# Patient Record
Sex: Female | Born: 2005 | Race: White | Hispanic: No | Marital: Single | Smoking: Current every day smoker
Health system: Southern US, Community
[De-identification: ages and names within clinical notes are randomized; demographics above are authoritative.]

## PROBLEM LIST (undated history)

## (undated) DIAGNOSIS — F32A Depression, unspecified: Secondary | ICD-10-CM

## (undated) DIAGNOSIS — T7840XA Allergy, unspecified, initial encounter: Secondary | ICD-10-CM

## (undated) DIAGNOSIS — F419 Anxiety disorder, unspecified: Secondary | ICD-10-CM

## (undated) DIAGNOSIS — F909 Attention-deficit hyperactivity disorder, unspecified type: Secondary | ICD-10-CM

## (undated) HISTORY — DX: Allergy, unspecified, initial encounter: T78.40XA

## (undated) HISTORY — DX: Attention-deficit hyperactivity disorder, unspecified type: F90.9

## (undated) HISTORY — DX: Depression, unspecified: F32.A

## (undated) HISTORY — DX: Anxiety disorder, unspecified: F41.9

## (undated) HISTORY — PX: FOOT SURGERY: SHX648

---
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
## 2023-02-02 ENCOUNTER — Encounter: Payer: Self-pay | Admitting: Podiatry

## 2023-02-10 ENCOUNTER — Ambulatory Visit: Payer: BC Managed Care – PPO | Admitting: Podiatry

## 2023-02-22 ENCOUNTER — Institutional Professional Consult (permissible substitution): Payer: BC Managed Care – PPO | Admitting: Family

## 2023-03-18 ENCOUNTER — Encounter: Payer: Self-pay | Admitting: Podiatry

## 2023-03-20 ENCOUNTER — Encounter: Payer: Self-pay | Admitting: Obstetrics and Gynecology

## 2023-03-20 DIAGNOSIS — Z3041 Encounter for surveillance of contraceptive pills: Secondary | ICD-10-CM

## 2023-03-20 NOTE — Telephone Encounter (Signed)
AEX 05/10/2022--recall sent per EMR. Nothing scheduled yet. Please advise if ok to send 30 or 90 days supply to pt? Thanks.   P.S. Will remind her to schedule AEX at her earliest convenience.

## 2023-03-21 NOTE — Telephone Encounter (Signed)
Per BS: "Ok to refill birth control pills for 3 months with no refills. Needs office visit for further medication.  [employer]"

## 2023-03-22 MED ORDER — DROSPIRENONE-ETHINYL ESTRADIOL 3-0.03 MG PO TABS: 1.0000 | ORAL_TABLET | Freq: Every day | ORAL | 0 refills | Status: DC

## 2023-03-22 NOTE — Telephone Encounter (Signed)
Routing to provider for final review and closing.

## 2023-04-10 ENCOUNTER — Other Ambulatory Visit: Payer: Self-pay | Admitting: Obstetrics and Gynecology

## 2023-04-10 DIAGNOSIS — Z3041 Encounter for surveillance of contraceptive pills: Secondary | ICD-10-CM

## 2023-04-11 NOTE — Telephone Encounter (Signed)
Med refill request:Yasmin 3-0.03 mg tab Last AEX: 05/10/22 -BS Next AEX: Not scheduled. Last MMG (if hormonal med) N/A  Call to patient. Left message to call [facility] at [phone], option 4. Did not leave detailed message, number not on dpr.  Call placed to home number, spoke with father, was advised [place] in [place], no additional information provided.    Rx refused. Rx sent on 03/22/23 #84/0RF Needs updated AEX.   Routing to provider for final review. Will close encounter.

## 2023-04-11 NOTE — Telephone Encounter (Addendum)
Pt returned call.  Msg sent to appt desk to schedule AEX.   Left this # to CB [phone].

## 2023-04-13 ENCOUNTER — Encounter: Payer: Self-pay | Admitting: Podiatry

## 2023-05-16 ENCOUNTER — Ambulatory Visit: Payer: BC Managed Care – PPO | Admitting: Podiatry

## 2023-06-10 ENCOUNTER — Other Ambulatory Visit: Payer: Self-pay | Admitting: Obstetrics and Gynecology

## 2023-06-10 DIAGNOSIS — Z3041 Encounter for surveillance of contraceptive pills: Secondary | ICD-10-CM

## 2023-06-12 NOTE — Telephone Encounter (Signed)
Medication refill request: yasmin Last AEX:  05-10-22 Next AEX: 08-28-23 Last MMG (if hormonal medication request): 02-22-12 breast u/s neg Refill authorized: please approve if appropriate

## 2023-08-01 ENCOUNTER — Ambulatory Visit: Payer: BC Managed Care – PPO | Admitting: Physician Assistant

## 2023-08-01 ENCOUNTER — Encounter: Payer: Self-pay | Admitting: Physician Assistant

## 2023-08-01 VITALS — BP 100/70 | HR 94 | Temp 98.2°F | Ht 63.0 in | Wt 141.0 lb

## 2023-08-01 DIAGNOSIS — R0981 Nasal congestion: Secondary | ICD-10-CM | POA: Diagnosis not present

## 2023-08-01 DIAGNOSIS — R5383 Other fatigue: Secondary | ICD-10-CM

## 2023-08-01 DIAGNOSIS — M545 Low back pain, unspecified: Secondary | ICD-10-CM

## 2023-08-01 MED ORDER — AMOXICILLIN-POT CLAVULANATE 875-125 MG PO TABS: 1.0000 | ORAL_TABLET | Freq: Two times a day (BID) | ORAL | 0 refills | Status: DC

## 2023-08-01 NOTE — Patient Instructions (Signed)
It was great to see you!  Take care,  Krystiana Fornes PA-C  

## 2023-08-01 NOTE — Progress Notes (Signed)
Kiara Travis is a 17 y.o. female here for a new problem.  History of Present Illness:   Chief Complaint  Patient presents with   Back Pain    Pt c/o low back pain x 1 week, took Ibuprofen and heating pad some relief, prior to menstrual cycle.   Sinus Problem    Pt c/o sinus problem, nasal congestion, ear pressure x 4 days, nausea. Slight cough expectorating yellow/green sputum. Denies fever or chills.   Fatigue    Pt c/o feeling tired x 6 months, bruising on legs.   HPI  Low Back Pain: Complains of low back pain for the past week. Describes trying to bend over to pick up clothes and feeling pain that took her breath away.  Reports the severity of her pain has remained the same.  States that she has tried Ibuprofen (with temporary relief) and using a heating pad before bed.  Denies prior injury, UTI sx, numbness/tingling, pain diffuse to legs, saddle anesthesia, or tenderness to spine.  Nasal Congestion: Complains of nasal and sinus congestion, ear congestion, and nausea for the past 4 days.  Reports her initial symptom was a sore throat that has since resolved.  Otherwise endorses mild cough with expectoration of yellow/green sputum. States that she tried a [employer] but it couldn't drain properly, and Sudafed.  Denies fever/chills.  Fatigue: Complains of fatigue for the past 6 months.  Endorses bruising on legs and menorrhagia.  Reports that she is currently on Yasmin birth control.  Denies concern for pregnancy. She has a history of iron deficiency and is wondering if she needs iron supplementation She reports that she has very heavy periods  Past Medical History:  Diagnosis Date   ADHD (attention deficit hyperactivity disorder)    Allergy    Anxiety    Depression     Social History   Tobacco Use   Smoking status: Every Day    Types: E-cigarettes    Start date: 08/29/2017   Smokeless tobacco: Never  Vaping Use   Vaping status: Every Day   Substances: Nicotine   Substance Use Topics   Alcohol use: Not Currently    Comment: 1 beer/month   Drug use: No   Past Surgical History:  Procedure Laterality Date   FOOT SURGERY     Right   Family History  Problem Relation Age of Onset   Miscarriages / Stillbirths Mother    Arthritis Mother    Hypertension Father    Hyperlipidemia Father    Hearing loss Father    Arthritis Maternal Grandmother    Drug abuse Maternal Grandfather    Alcohol abuse Maternal Grandfather    Hyperlipidemia Paternal Grandmother    Heart disease Paternal Grandmother    Hearing loss Paternal Grandfather    Heart disease Paternal Grandfather    Hyperlipidemia Paternal Grandfather    Heart attack Paternal Grandfather    Allergies  Allergen Reactions   Chlorine Rash   Latex Rash   Nystatin Rash and Hives   Sulfa Antibiotics Hives    Verified hives   Current Medications:   Current Outpatient Medications:    drospirenone-ethinyl estradiol (YASMIN) 3-0.03 MG tablet, TAKE 1 TABLET BY MOUTH EVERY DAY, Disp: 84 tablet, Rfl: 0   ibuprofen (ADVIL) 600 MG tablet, Take 1 tablet (600 mg total) by mouth every 6 (six) hours as needed., Disp: 30 tablet, Rfl: 1   JORNAY PM 20 MG CP24, Take 1 capsule by mouth at bedtime., Disp: , Rfl:  Multiple Vitamin (MULTIVITAMIN) capsule, Take 1 capsule by mouth daily., Disp: , Rfl:    sertraline (ZOLOFT) 25 MG tablet, Take 25 mg by mouth daily., Disp: , Rfl:   Review of Systems:   ROS See pertinent positives and negatives as per the HPI.  Vitals:   Vitals:   08/01/23 1451  BP: 100/70  Pulse: 94  Temp: 98.2 F (36.8 C)  TempSrc: Temporal  SpO2: 98%  Weight: 141 lb (64 kg)  Height: 5\' 3"  (1.6 m)     Body mass index is 24.98 kg/m.  Physical Exam:   Physical Exam Vitals and nursing note reviewed.  Constitutional:      General: She is not in acute distress.    Appearance: She is well-developed. She is not ill-appearing or toxic-appearing.  HENT:     Head: Normocephalic and  atraumatic.     Right Ear: Tympanic membrane, ear canal and external ear normal. Tympanic membrane is not erythematous, retracted or bulging.     Left Ear: Tympanic membrane, ear canal and external ear normal. Tympanic membrane is not erythematous, retracted or bulging.     Nose:     Right Sinus: Maxillary sinus tenderness and frontal sinus tenderness present.     Left Sinus: Maxillary sinus tenderness and frontal sinus tenderness present.     Mouth/Throat:     Pharynx: Uvula midline. No posterior oropharyngeal erythema.  Eyes:     General: Lids are normal.     Conjunctiva/sclera: Conjunctivae normal.  Neck:     Trachea: Trachea normal.  Cardiovascular:     Rate and Rhythm: Normal rate and regular rhythm.     Pulses: Normal pulses.     Heart sounds: Normal heart sounds, S1 normal and S2 normal.  Pulmonary:     Effort: Pulmonary effort is normal.     Breath sounds: Normal breath sounds. No decreased breath sounds, wheezing, rhonchi or rales.  Musculoskeletal:     Comments: No decreased ROM 2/2 pain with flexion/extension, lateral side bends, or rotation. Very mild reproducible tenderness with deep palpation to bilateral paraspinal lumbar muscles. No bony tenderness. No evidence of erythema, rash or ecchymosis.   Lymphadenopathy:     Cervical: No cervical adenopathy.  Skin:    General: Skin is warm and dry.  Neurological:     Mental Status: She is alert.     GCS: GCS eye subscore is 4. GCS verbal subscore is 5. GCS motor subscore is 6.  Psychiatric:        Speech: Speech normal.        Behavior: Behavior normal. Behavior is cooperative.     Assessment and Plan:   Sinus congestion No red flags on exam.   Will initiate Augmentin per orders.  Discussed taking medications as prescribed.  Reviewed return precautions including new or worsening fever, SOB, new or worsening cough or other concerns.  Push fluids and rest.  I recommend that patient follow-up if symptoms worsen or  persist despite treatment x 7-10 days, sooner if needed.  Acute bilateral low back pain without sciatica No red flags Suspect muscle strain Recommend continued supportive care with gentle stretching and NSAIDs as needed If new, worsening or lack of improvement in a total of 2 weeks recommend close follow-up  Fatigue, unspecified type Patient is requesting iron panel Denies any other specific blood work or workup for fatigue at this time Follow-up based on results and clinical symptoms   I,Emily Lagle,acting as a Neurosurgeon for [employer], PA.,have documented  all relevant documentation on the behalf of Jarold Motto, PA,as directed by  Jarold Motto, PA while in the presence of Jarold Motto, [place].  I, Jarold Motto, [place], have reviewed all documentation for this visit. The documentation on 08/01/23 for the exam, diagnosis, procedures, and orders are all accurate and complete.  Jarold Motto, PA-C

## 2023-08-02 LAB — COMPREHENSIVE METABOLIC PANEL
ALT: 16 U/L (ref 0–35)
AST: 19 U/L (ref 0–37)
Albumin: 4.6 g/dL (ref 3.5–5.2)
Alkaline Phosphatase: 49 U/L (ref 47–119)
BUN: 7 mg/dL (ref 6–23)
CO2: 26 meq/L (ref 19–32)
Calcium: 9.4 mg/dL (ref 8.4–10.5)
Chloride: 104 meq/L (ref 96–112)
Creatinine, Ser: 0.78 mg/dL (ref 0.40–1.20)
GFR: 110 mL/min (ref >60.00)
Glucose, Bld: 97 mg/dL (ref 70–99)
Potassium: 3.9 meq/L (ref 3.5–5.1)
Sodium: 138 meq/L (ref 135–145)
Total Bilirubin: 0.3 mg/dL (ref 0.2–1.2)
Total Protein: 7.3 g/dL (ref 6.0–8.3)

## 2023-08-02 LAB — IBC + FERRITIN
Ferritin: 25.9 ng/mL (ref 10.0–291.0)
Iron: 58 ug/dL (ref 42–145)
Saturation Ratios: 16.2 % — ABNORMAL LOW (ref 20.0–50.0)
TIBC: 357 ug/dL (ref 250.0–450.0)
Transferrin: 255 mg/dL (ref 212.0–360.0)

## 2023-08-03 ENCOUNTER — Encounter: Payer: Self-pay | Admitting: Physician Assistant

## 2023-08-14 NOTE — Progress Notes (Signed)
17 y.o. G0P0000 Single Caucasian female here for annual exam.  Pt wants to discuss hair loss that is occurring for the last several months.   Taking her birth control.  Wants to continue.   Off Lexapro. Taking Zoloft 50 mg daily.    Had her iron checked in 08/01/23.  Iron was 58.   Sees dermatology yearly for a mole skin check.   Moved back from [place] a month ago.  Just had a break up.   Patient is helping her dad build a house.   PCP: Jarold Motto, PA   Patient's last menstrual period was 07/31/2023 (exact date).           Sexually active: Yes.    The current method of family planning is OCP (estrogen/progesterone).    Menopausal hormone therapy:  n/a Exercising: No.   Smoker:  no, vaping  OB History  Gravida Para Term Preterm AB Living  0 0 0 0 0 0  SAB IAB Ectopic Multiple Live Births  0 0 0 0 0     HEALTH MAINTENANCE: Last 2 paps:  n/a History of abnormal Pap or positive HPV:  no Mammogram:   n/a Colonoscopy:  n/a Bone Density:  n/a  Result  n/a   Immunization History  Administered Date(s) Administered   HIB (PRP-OMP) 03/08/2004, 05/11/2004, 07/12/2004, 01/13/2005   Hepatitis A, Ped/Adol-2 Dose 04/15/2005, 11/01/2005   Hepatitis B, PED/ADOLESCENT 03/08/2004, 05/11/2004, 07/12/2004   Influenza,inj,Quad PF,6+ Mos 05/03/2019   MMR 01/13/2005, 01/27/2009   Meningococcal B Recombinant 11/17/2020, 04/20/2021   Meningococcal Conjugate 05/29/2015, 11/17/2020   PFIZER(Purple Top)SARS-COV-2 Vaccination 03/23/2020, 04/17/2020   Pneumococcal Conjugate-13 03/08/2004, 05/11/2004, 07/12/2004, 01/13/2005   Td 05/29/2015   Tdap 05/29/2015   Varicella 01/13/2005, 01/27/2009      reports that she has been smoking e-cigarettes. She started smoking about 6 years ago. She has never used smokeless tobacco. She reports that she does not currently use alcohol. She reports that she does not use drugs.  Past Medical History:  Diagnosis Date   ADHD (attention deficit  hyperactivity disorder)    Allergy    Anxiety    Depression     Past Surgical History:  Procedure Laterality Date   FOOT SURGERY     Right    Current Outpatient Medications  Medication Sig Dispense Refill   drospirenone-ethinyl estradiol (YASMIN) 3-0.03 MG tablet TAKE 1 TABLET BY MOUTH EVERY DAY 84 tablet 0   ibuprofen (ADVIL) 600 MG tablet Take 1 tablet (600 mg total) by mouth every 6 (six) hours as needed. 30 tablet 1   JORNAY PM 20 MG CP24 Take 1 capsule by mouth at bedtime.     Multiple Vitamin (MULTIVITAMIN) capsule Take 1 capsule by mouth daily.     sertraline (ZOLOFT) 50 MG tablet Take 50 mg by mouth daily.     No current facility-administered medications for this visit.    ALLERGIES: Chlorine, Latex, Nystatin, and Sulfa antibiotics  Family History  Problem Relation Age of Onset   Miscarriages / [place] Mother    Arthritis Mother    Hypertension Father    Hyperlipidemia Father    Hearing loss Father    Arthritis Maternal Grandmother    Drug abuse Maternal Grandfather    Alcohol abuse Maternal Grandfather    Hyperlipidemia Paternal Grandmother    Heart disease Paternal Grandmother    Hearing loss Paternal Grandfather    Heart disease Paternal Grandfather    Hyperlipidemia Paternal Grandfather    Heart attack Paternal  Grandfather     Review of Systems  All other systems reviewed and are negative.   PHYSICAL EXAM:  BP 114/70 (BP Location: Left Arm, Patient Position: Sitting, Cuff Size: Small)   Ht 5\' 4"  (1.626 m)   Wt 137 lb (62.1 kg)   LMP 07/31/2023 (Exact Date)   BMI 23.52 kg/m     General appearance: alert, cooperative and appears stated age Head: normocephalic, without obvious abnormality, atraumatic Neck: no adenopathy, supple, symmetrical, trachea midline and thyroid normal to inspection and palpation Lungs: clear to auscultation bilaterally Breasts: normal appearance, no masses or tenderness, No nipple retraction or dimpling, No nipple  discharge or bleeding, No axillary adenopathy Heart: regular rate and rhythm Abdomen: soft, non-tender; no masses, no organomegaly Extremities: extremities normal, atraumatic, no cyanosis or edema Skin: skin color, texture, turgor normal. No rashes or lesions Lymph nodes: cervical, supraclavicular, and axillary nodes normal. Neurologic: grossly normal  Pelvic: External genitalia:  no lesions              No abnormal inguinal nodes palpated.              Urethra:  normal appearing urethra with no masses, tenderness or lesions              Bartholins and Skenes: normal                 Vagina: normal appearing vagina with normal color and discharge, no lesions              Cervix: no lesions              Pap taken: No. Bimanual Exam:  Uterus:  normal size, contour, position, consistency, mobility, non-tender              Adnexa: no mass, fullness, tenderness       Chaperone was present for exam:  Warren Lacy, CMA  ASSESSMENT: Well woman visit with gynecologic exam Hair loss.   STD screening.   PLAN:  Self breast awareness reviewed. Pap and HRV collected:  no.  Will do at age 49.  Guidelines for Calcium, Vitamin D, regular exercise program including cardiovascular and weight bearing exercise. Medication refills:  Yasmin x 1 year.  We discussed etiologies of hair loss:  hypothyroidism, changes in hormones (stopping pills), iron deficiency, stress. TFTs, STD screening.  Follow up:  yearly and prn.

## 2023-08-28 ENCOUNTER — Other Ambulatory Visit (HOSPITAL_COMMUNITY)
Admission: RE | Admit: 2023-08-28 | Discharge: 2023-08-28 | Disposition: A | Discharge status: Home / Self Care | Payer: BC Managed Care – PPO | Source: Ambulatory Visit | Attending: Obstetrics and Gynecology | Admitting: Obstetrics and Gynecology

## 2023-08-28 ENCOUNTER — Encounter: Payer: Self-pay | Admitting: Obstetrics and Gynecology

## 2023-08-28 ENCOUNTER — Ambulatory Visit (INDEPENDENT_AMBULATORY_CARE_PROVIDER_SITE_OTHER): Payer: BC Managed Care – PPO | Admitting: Obstetrics and Gynecology

## 2023-08-28 VITALS — BP 114/70 | Ht 64.0 in | Wt 137.0 lb

## 2023-08-28 DIAGNOSIS — Z3041 Encounter for surveillance of contraceptive pills: Secondary | ICD-10-CM

## 2023-08-28 DIAGNOSIS — L659 Nonscarring hair loss, unspecified: Secondary | ICD-10-CM | POA: Diagnosis not present

## 2023-08-28 DIAGNOSIS — Z113 Encounter for screening for infections with a predominantly sexual mode of transmission: Secondary | ICD-10-CM | POA: Diagnosis present

## 2023-08-28 DIAGNOSIS — Z1159 Encounter for screening for other viral diseases: Secondary | ICD-10-CM

## 2023-08-28 DIAGNOSIS — Z01419 Encounter for gynecological examination (general) (routine) without abnormal findings: Secondary | ICD-10-CM

## 2023-08-28 DIAGNOSIS — Z114 Encounter for screening for human immunodeficiency virus [HIV]: Secondary | ICD-10-CM

## 2023-08-28 MED ORDER — DROSPIRENONE-ETHINYL ESTRADIOL 3-0.03 MG PO TABS: 1.0000 | ORAL_TABLET | Freq: Every day | ORAL | 3 refills | Status: DC

## 2023-08-28 NOTE — Patient Instructions (Signed)

## 2023-08-29 LAB — HEPATITIS C ANTIBODY: Hepatitis C Ab: NONREACTIVE

## 2023-08-29 LAB — T4, FREE: Free T4: 1.2 ng/dL (ref 0.8–1.4)

## 2023-08-29 LAB — CERVICOVAGINAL ANCILLARY ONLY
Chlamydia: NEGATIVE
Comment: NEGATIVE
Comment: NEGATIVE
Comment: NORMAL
Neisseria Gonorrhea: NEGATIVE
Trichomonas: NEGATIVE

## 2023-08-29 LAB — RPR: RPR Ser Ql: NONREACTIVE

## 2023-08-29 LAB — HIV ANTIBODY (ROUTINE TESTING W REFLEX): HIV 1&2 Ab, 4th Generation: NONREACTIVE

## 2023-08-29 LAB — TSH: TSH: 1.09 m[IU]/L

## 2023-10-11 DIAGNOSIS — M79642 Pain in left hand: Secondary | ICD-10-CM | POA: Insufficient documentation

## 2023-11-05 ENCOUNTER — Encounter: Payer: Self-pay | Admitting: Obstetrics and Gynecology

## 2023-11-09 ENCOUNTER — Ambulatory Visit: Payer: BC Managed Care – PPO | Admitting: Physician Assistant

## 2023-11-09 ENCOUNTER — Encounter: Payer: Self-pay | Admitting: Physician Assistant

## 2023-11-09 VITALS — BP 102/62 | HR 64 | Temp 97.7°F | Ht 64.0 in | Wt 135.0 lb

## 2023-11-09 DIAGNOSIS — F411 Generalized anxiety disorder: Secondary | ICD-10-CM | POA: Diagnosis not present

## 2023-11-09 DIAGNOSIS — D509 Iron deficiency anemia, unspecified: Secondary | ICD-10-CM

## 2023-11-09 DIAGNOSIS — F900 Attention-deficit hyperactivity disorder, predominantly inattentive type: Secondary | ICD-10-CM

## 2023-11-09 DIAGNOSIS — E559 Vitamin D deficiency, unspecified: Secondary | ICD-10-CM | POA: Diagnosis not present

## 2023-11-09 DIAGNOSIS — Z0000 Encounter for general adult medical examination without abnormal findings: Secondary | ICD-10-CM | POA: Diagnosis not present

## 2023-11-09 LAB — CBC WITH DIFFERENTIAL/PLATELET
Basophils Absolute: 0.1 10*3/uL (ref 0.0–0.1)
Basophils Relative: 0.9 % (ref 0.0–3.0)
Eosinophils Absolute: 0.1 10*3/uL (ref 0.0–0.7)
Eosinophils Relative: 1.5 % (ref 0.0–5.0)
HCT: 36.9 % (ref 36.0–49.0)
Hemoglobin: 12.7 g/dL (ref 12.0–16.0)
Lymphocytes Relative: 30.4 % (ref 24.0–48.0)
Lymphs Abs: 1.6 10*3/uL (ref 0.7–4.0)
MCHC: 34.4 g/dL (ref 31.0–37.0)
MCV: 90.5 fl (ref 78.0–98.0)
Monocytes Absolute: 0.3 10*3/uL (ref 0.1–1.0)
Monocytes Relative: 5.8 % (ref 3.0–12.0)
Neutro Abs: 3.3 10*3/uL (ref 1.4–7.7)
Neutrophils Relative %: 61.4 % (ref 43.0–71.0)
Platelets: 263 10*3/uL (ref 150.0–575.0)
RBC: 4.08 Mil/uL (ref 3.80–5.70)
RDW: 12.3 % (ref 11.4–15.5)
WBC: 5.4 10*3/uL (ref 4.5–13.5)

## 2023-11-09 LAB — LIPID PANEL
Cholesterol: 164 mg/dL (ref 0–200)
HDL: 68.5 mg/dL (ref >39.00)
LDL Cholesterol: 83 mg/dL (ref 0–99)
NonHDL: 95.59
Total CHOL/HDL Ratio: 2
Triglycerides: 61 mg/dL (ref 0.0–149.0)
VLDL: 12.2 mg/dL (ref 0.0–40.0)

## 2023-11-09 LAB — COMPREHENSIVE METABOLIC PANEL
ALT: 14 U/L (ref 0–35)
AST: 14 U/L (ref 0–37)
Albumin: 4.2 g/dL (ref 3.5–5.2)
Alkaline Phosphatase: 41 U/L — ABNORMAL LOW (ref 47–119)
BUN: 8 mg/dL (ref 6–23)
CO2: 27 meq/L (ref 19–32)
Calcium: 9.4 mg/dL (ref 8.4–10.5)
Chloride: 106 meq/L (ref 96–112)
Creatinine, Ser: 0.77 mg/dL (ref 0.40–1.20)
GFR: 111.5 mL/min (ref >60.00)
Glucose, Bld: 65 mg/dL — ABNORMAL LOW (ref 70–99)
Potassium: 4.2 meq/L (ref 3.5–5.1)
Sodium: 140 meq/L (ref 135–145)
Total Bilirubin: 0.2 mg/dL (ref 0.2–1.2)
Total Protein: 6.5 g/dL (ref 6.0–8.3)

## 2023-11-09 LAB — IBC + FERRITIN
Ferritin: 30 ng/mL (ref 10.0–291.0)
Iron: 90 ug/dL (ref 42–145)
Saturation Ratios: 23.5 % (ref 20.0–50.0)
TIBC: 383.6 ug/dL (ref 250.0–450.0)
Transferrin: 274 mg/dL (ref 212.0–360.0)

## 2023-11-09 LAB — VITAMIN D 25 HYDROXY (VIT D DEFICIENCY, FRACTURES): VITD: 31.31 ng/mL (ref 30.00–100.00)

## 2023-11-09 NOTE — Progress Notes (Signed)
 Subjective:    Kiara Travis is a 18 y.o. female and is here for a comprehensive physical exam.  HPI  Acute Concerns: None.  Chronic Issues: GAD // ADHD Pt is on Zoloft  mg once daily and Jornay 20 mg once nightly.  Good compliance and tolerance reported.  States Ophelia Charter is the only ADHD medication that has significantly helped her.   Vitamin D and iron deficiency She would like blood work checked today to evaluate for this She takes supplements for both vitamin D and iron every other day  Health Maintenance: Immunizations -- UTD Colonoscopy -- N/A Mammogram -- N/A PAP -- N/A Bone Density -- N/A Diet -- Healthy diet.  Exercise -- Stays very active throughout the day.   Sleep habits -- No concerns today. Mood -- Stable.  UTD with dentist? - Yes UTD with eye doctor? - Yes UTD with dermatology? - Yes, follows up once annually.   Weight history: Wt Readings from Last 10 Encounters:  11/09/23 135 lb (61.2 kg) (62%, Z= 0.30)*  08/28/23 137 lb (62.1 kg) (65%, Z= 0.40)*  08/01/23 141 lb (64 kg) (71%, Z= 0.56)*  11/08/22 151 lb 6.4 oz (68.7 kg) (83%, Z= 0.97)*  08/15/22 144 lb 8 oz (65.5 kg) (78%, Z= 0.78)*  05/10/22 146 lb (66.2 kg) (80%, Z= 0.85)*  04/13/21 145 lb (65.8 kg) (82%, Z= 0.91)*  01/19/21 145 lb (65.8 kg) (82%, Z= 0.93)*  11/10/20 141 lb (64 kg) (79%, Z= 0.81)*  10/26/20 138 lb (62.6 kg) (76%, Z= 0.72)*   * Growth percentiles are based on CDC (Girls, 2-20 Years) data.   Body mass index is 23.17 kg/m. Patient's last menstrual period was 11/06/2023 (approximate).  Alcohol use:  reports that she does not currently use alcohol.  Tobacco use:  Tobacco Use: High Risk (11/09/2023)   Patient History    Smoking Tobacco Use: Every Day    Smokeless Tobacco Use: Never    Passive Exposure: Not on file   Eligible for lung cancer screening? no     11/08/2022   10:29 AM  Depression screen PHQ 2/9  Decreased Interest 0  Down, Depressed, Hopeless 0  PHQ - 2  Score 0  Altered sleeping 1  Tired, decreased energy 1  Change in appetite 0  Feeling bad or failure about yourself  0  Trouble concentrating 0  Moving slowly or fidgety/restless 0  Suicidal thoughts 0  PHQ-9 Score 2  Difficult doing work/chores Somewhat difficult     Other providers/specialists: Patient Care Team: Jarold Motto, [place] as PCP - General (Physician Assistant) Carron Curie, NP as Nurse Practitioner (Family Medicine)    PMHx, SurgHx, SocialHx, Medications, and Allergies were reviewed in the Visit Navigator and updated as appropriate.   Past Medical History:  Diagnosis Date   ADHD (attention deficit hyperactivity disorder)    Allergy    Anxiety    Depression      Past Surgical History:  Procedure Laterality Date   FOOT SURGERY     Right     Family History  Problem Relation Age of Onset   Miscarriages / [place] Mother    Arthritis Mother    Hypertension Father    Hyperlipidemia Father    Hearing loss Father    Arthritis Maternal Grandmother    Drug abuse Maternal Grandfather    Alcohol abuse Maternal Grandfather    Hyperlipidemia Paternal Grandmother    Heart disease Paternal Grandmother    Hearing loss Paternal Actor  Heart disease Paternal Grandfather    Hyperlipidemia Paternal Grandfather    Heart attack Paternal Grandfather     Social History   Tobacco Use   Smoking status: Every Day    Types: E-cigarettes    Start date: 08/29/2017   Smokeless tobacco: Never  Vaping Use   Vaping status: Every Day   Substances: Nicotine  Substance Use Topics   Alcohol use: Not Currently    Comment: 1 beer/month   Drug use: No    Review of Systems:   Review of Systems  Constitutional:  Negative for chills, fever, malaise/fatigue and weight loss.  HENT:  Negative for hearing loss, sinus pain and sore throat.   Respiratory:  Negative for cough and hemoptysis.   Cardiovascular:  Negative for chest pain, palpitations, leg  swelling and PND.  Gastrointestinal:  Negative for abdominal pain, constipation, diarrhea, heartburn, nausea and vomiting.  Genitourinary:  Negative for dysuria, frequency and urgency.  Musculoskeletal:  Negative for back pain, myalgias and neck pain.  Skin:  Negative for itching and rash.  Neurological:  Negative for dizziness, tingling, seizures and headaches.  Endo/Heme/Allergies:  Negative for polydipsia.  Psychiatric/Behavioral:  Negative for depression. The patient is not nervous/anxious.      Objective:   BP 102/62   Pulse 64   Temp 97.7 F (36.5 C) (Temporal)   Ht 5\' 4"  (1.626 m)   Wt 135 lb (61.2 kg)   LMP 11/06/2023 (Approximate)   SpO2 98%   BMI 23.17 kg/m  Body mass index is 23.17 kg/m.   General Appearance:    Alert, cooperative, no distress, appears stated age  Head:    Normocephalic, without obvious abnormality, atraumatic  Eyes:    PERRL, conjunctiva/corneas clear, EOM's intact, fundi    benign, both eyes  Ears:    Normal TM's and external ear canals, both ears  Nose:   Nares normal, septum midline, mucosa normal, no drainage    or sinus tenderness  Throat:   Lips, mucosa, and tongue normal; teeth and gums normal  Neck:   Supple, symmetrical, trachea midline, no adenopathy;    thyroid:  no enlargement/tenderness/nodules; no carotid   bruit or JVD  Back:     Symmetric, no curvature, ROM normal, no CVA tenderness  Lungs:     Clear to auscultation bilaterally, respirations unlabored  Chest Wall:    No tenderness or deformity   Heart:    Regular rate and rhythm, S1 and S2 normal, no murmur, rub or gallop  Breast Exam:  Deferred  Abdomen:     Soft, non-tender, bowel sounds active all four quadrants,    no masses, no organomegaly  Genitalia:  Deferred  Extremities:   Extremities normal, atraumatic, no cyanosis or edema  Pulses:   2+ and symmetric all extremities  Skin:   Skin color, texture, turgor normal, no rashes or lesions  Lymph nodes:   Cervical,  supraclavicular, and axillary nodes normal  Neurologic:   CNII-XII intact, normal strength, sensation and reflexes    throughout    Assessment/Plan:   Routine physical examination Today patient counseled on age appropriate routine health concerns for screening and prevention, each reviewed and up to date or declined. Immunizations reviewed and up to date or declined. Labs ordered and reviewed. Risk factors for depression reviewed and negative. Hearing function and visual acuity are intact. ADLs screened and addressed as needed. Functional ability and level of safety reviewed and appropriate. Education, counseling and referrals performed based on assessed risks today.  Patient provided with a copy of personalized plan for preventive services.  Vitamin D deficiency Update vitamin D and provide recommendations  Iron deficiency anemia, unspecified iron deficiency anemia type Update iron and ferritin and provide recommendations  GAD (generalized anxiety disorder); ADHD (attention deficit hyperactivity disorder), inattentive type Reviewed  management per specialist   I, Isabelle Course, acting as a scribe for Jarold Motto, [place]., have documented all relevant documentation on the behalf of Jarold Motto, [place], as directed by  Jarold Motto, PA while in the presence of Jarold Motto, [place].  I, Isabelle Course, have reviewed all documentation for this visit. The documentation on 11/09/23 for the exam, diagnosis, procedures, and orders are all accurate and complete.  Jarold Motto, PA-C [facility]

## 2023-11-10 ENCOUNTER — Encounter: Payer: Self-pay | Admitting: Physician Assistant

## 2024-01-26 ENCOUNTER — Encounter: Payer: Self-pay | Admitting: Physician Assistant

## 2024-01-29 ENCOUNTER — Encounter: Payer: Self-pay | Admitting: Physician Assistant

## 2024-01-29 ENCOUNTER — Ambulatory Visit: Admitting: Physician Assistant

## 2024-01-29 VITALS — BP 100/70 | HR 79 | Temp 97.3°F | Ht 64.0 in | Wt 136.0 lb

## 2024-01-29 DIAGNOSIS — G44229 Chronic tension-type headache, not intractable: Secondary | ICD-10-CM | POA: Diagnosis not present

## 2024-01-29 DIAGNOSIS — D509 Iron deficiency anemia, unspecified: Secondary | ICD-10-CM

## 2024-01-29 LAB — CBC WITH DIFFERENTIAL/PLATELET
Basophils Absolute: 0 10*3/uL (ref 0.0–0.1)
Basophils Relative: 0.5 % (ref 0.0–3.0)
Eosinophils Absolute: 0.1 10*3/uL (ref 0.0–0.7)
Eosinophils Relative: 1.5 % (ref 0.0–5.0)
HCT: 36.6 % (ref 36.0–46.0)
Hemoglobin: 12.4 g/dL (ref 12.0–15.0)
Lymphocytes Relative: 37.9 % (ref 12.0–46.0)
Lymphs Abs: 2.1 10*3/uL (ref 0.7–4.0)
MCHC: 33.8 g/dL (ref 30.0–36.0)
MCV: 88.2 fl (ref 78.0–100.0)
Monocytes Absolute: 0.4 10*3/uL (ref 0.1–1.0)
Monocytes Relative: 6.6 % (ref 3.0–12.0)
Neutro Abs: 2.9 10*3/uL (ref 1.4–7.7)
Neutrophils Relative %: 53.5 % (ref 43.0–77.0)
Platelets: 246 10*3/uL (ref 150.0–400.0)
RBC: 4.15 Mil/uL (ref 3.87–5.11)
RDW: 12.2 % (ref 11.5–14.6)
WBC: 5.5 10*3/uL (ref 4.5–10.5)

## 2024-01-29 LAB — IBC + FERRITIN
Ferritin: 25.6 ng/mL (ref 10.0–291.0)
Iron: 110 ug/dL (ref 42–145)
Saturation Ratios: 28 % (ref 20.0–50.0)
TIBC: 393.4 ug/dL (ref 250.0–450.0)
Transferrin: 281 mg/dL (ref 212.0–360.0)

## 2024-01-29 NOTE — Progress Notes (Signed)
 Kiara Travis is a 18 y.o. female here for a new problem.  History of Present Illness:   Chief Complaint  Patient presents with   Migraine    Pt had a headache that started 3-4 weeks ago, has never gone away has a dull headache everyday, light sensitivity. Has been taking Ibuprofen , Tylenol  and Excedrin, ice packs on forehead.   Migraine / headaches: Pt complains of persistent migraines and headaches, starting 3-4 weeks ago.  Since her first migraine she has sporadic migraines that gradually increase in pain from her chronic headaches.  During migraines has frontal pain, light and sound sensitivity, and has an increased desire to go to sleep.  Currently has a headache today; last took Ibuprofen  this morning.  Typically takes Excedrin for migraines, Ibuprofen  and Tylenol  for intense headaches, and drinks caffeine with mild headaches.  She is UTD with routine vision exams, was last seen a couple years ago.  Wears contacts and has not had a change in prescription in about 1.5 yrs.  No changes to caffeine intake; drinks 1 Dr. Kathlene Paradise daily.  Drinks plenty of water throughout the day. Reports multiple concussions as a child; was in gymnastics as a child. Did not see neurology, but  Fmhx of migraines (brother) and brain aneurysms (maternal grandpa and paternal grandmother). Denies any auras, nausea, chest pain, SOB, or sleep changes. No concerns for pregnancy.   She is uncertain if her current birth control may be a potential cause of migraines/headaches.  Had a couple missed doses of Jornay and Sertraline due to losing her medications while moving; was not able to get a refill.  Changed to Yasmin  about 1 year ago due to frequent cramps with her previous OCP.  In the last couple months her cramps have become more frequent.    Past Medical History:  Diagnosis Date   ADHD (attention deficit hyperactivity disorder)    Allergy    Anxiety    Depression      Social History   Tobacco Use    Smoking status: Every Day    Types: E-cigarettes    Start date: 08/29/2017   Smokeless tobacco: Never   Tobacco comments:    Nicotine pouches 3-4 days  Vaping Use   Vaping status: Every Day   Substances: Nicotine  Substance Use Topics   Alcohol use: Not Currently    Comment: 1 beer/month   Drug use: No    Past Surgical History:  Procedure Laterality Date   FOOT SURGERY     Right    Family History  Problem Relation Age of Onset   Miscarriages / Stillbirths Mother    Arthritis Mother    Hypertension Father    Hyperlipidemia Father    Hearing loss Father    High Cholesterol Brother    Migraines Brother    Arthritis Maternal Grandmother    Drug abuse Maternal Grandfather    Alcohol abuse Maternal Grandfather    Anuerysm Maternal Grandfather    Hyperlipidemia Paternal Grandmother    Heart disease Paternal Grandmother    Aneurysm Paternal Grandmother    Hearing loss Paternal Grandfather    Heart disease Paternal Grandfather    Hyperlipidemia Paternal Grandfather    Heart attack Paternal Grandfather     Allergies  Allergen Reactions   Chloramine Hives   Chlorine Rash   Latex Rash   Nystatin Rash and Hives   Sulfa Antibiotics Hives    Verified hives    Current Medications:   Current Outpatient Medications:  chlorhexidine (PERIDEX) 0.12 % solution, Use as directed 5 mLs in the mouth or throat as needed., Disp: , Rfl:    cholecalciferol (VITAMIN D3) 25 MCG (1000 UNIT) tablet, Take 1,000 Units by mouth daily., Disp: , Rfl:    drospirenone -ethinyl estradiol  (YASMIN ) 3-0.03 MG tablet, Take 1 tablet by mouth daily., Disp: 84 tablet, Rfl: 3   ferrous sulfate 324 MG TBEC, Take 324 mg by mouth., Disp: , Rfl:    ibuprofen  (ADVIL ) 600 MG tablet, Take 1 tablet (600 mg total) by mouth every 6 (six) hours as needed., Disp: 30 tablet, Rfl: 1   JORNAY PM  20 MG CP24, Take 1 capsule by mouth at bedtime., Disp: , Rfl:    Multiple Vitamin (MULTIVITAMIN) capsule, Take 1 capsule by  mouth daily., Disp: , Rfl:    sertraline (ZOLOFT) 50 MG tablet, Take 50 mg by mouth daily., Disp: , Rfl:    Review of Systems:   Negative unless otherwise specified per HPI.  Vitals:   Vitals:   01/29/24 1104  BP: 100/70  Pulse: 79  Temp: (!) 97.3 F (36.3 C)  TempSrc: Temporal  SpO2: 97%  Weight: 136 lb (61.7 kg)  Height: 5\' 4"  (1.626 m)     Body mass index is 23.34 kg/m.  Physical Exam:   Physical Exam Vitals and nursing note reviewed.  Constitutional:      General: She is not in acute distress.    Appearance: She is well-developed. She is not ill-appearing or toxic-appearing.  Cardiovascular:     Rate and Rhythm: Normal rate and regular rhythm.     Pulses: Normal pulses.     Heart sounds: Normal heart sounds, S1 normal and S2 normal.  Pulmonary:     Effort: Pulmonary effort is normal.     Breath sounds: Normal breath sounds.  Skin:    General: Skin is warm and dry.  Neurological:     General: No focal deficit present.     Mental Status: She is alert.     GCS: GCS eye subscore is 4. GCS verbal subscore is 5. GCS motor subscore is 6.     Cranial Nerves: Cranial nerves 2-12 are intact.     Sensory: Sensation is intact.     Motor: Motor function is intact.     Coordination: Coordination is intact.     Gait: Gait is intact.  Psychiatric:        Speech: Speech normal.        Behavior: Behavior normal. Behavior is cooperative.     Assessment and Plan:   1. Chronic tension-type headache, not intractable (Primary) No red flags on exam -- neurology exam wnl Do not feel imaging is warranted at this time Continue to monitor headache(s), consider journaling to assess for possible triggers She is going to stop oral contraceptive pill(s) to see if this is the culprit If no improvement, I have asked her to let me know and we can advise on next steps She would like blood work today to assess for iron deficiency anemia -- this is reasonable and we will do so  today Discussed that current guidelines recommend consideration of aneurysm screening (two or more first degree relatives -- which she does not have) If oral contraceptive pill(s) is culprit - I asked her to message her gynecologist to assess for alternative medication She is also planning to discuss with her psychiatrist ER precautions advised during visit   2. Iron deficiency anemia, unspecified iron deficiency anemia type - IBC +  Ferritin - CBC with Differential/Platelet    I, Bernita Bristle, acting as a scribe for Alexander Iba, [place]., have documented all relevant documentation on the behalf of Alexander Iba, [place], as directed by   while in the presence of Alexander Iba, [place].  I, Alexander Iba, [place], have reviewed all documentation for this visit. The documentation on 01/29/24 for the exam, diagnosis, procedures, and orders are all accurate and complete.  I spent a total of 30 minutes on this visit, today 01/29/24, which included reviewing previous notes from gyn, ordering tests, discussing plan of care with patient and using shared-decision making on next steps, and documenting the findings in the note.  Alexander Iba, PA-C

## 2024-01-29 NOTE — Patient Instructions (Signed)
   Take care,  Jarold Motto PA-C

## 2024-01-30 ENCOUNTER — Ambulatory Visit: Payer: Self-pay | Admitting: Physician Assistant

## 2024-02-19 ENCOUNTER — Other Ambulatory Visit: Payer: Self-pay | Admitting: Physician Assistant

## 2024-02-19 DIAGNOSIS — G44229 Chronic tension-type headache, not intractable: Secondary | ICD-10-CM

## 2024-07-10 ENCOUNTER — Ambulatory Visit: Admitting: Neurology

## 2024-07-11 ENCOUNTER — Ambulatory Visit: Admitting: Neurology

## 2024-07-11 ENCOUNTER — Encounter: Payer: Self-pay | Admitting: Neurology

## 2024-07-11 VITALS — BP 108/73 | HR 100 | Ht 63.0 in | Wt 141.0 lb

## 2024-07-11 DIAGNOSIS — F900 Attention-deficit hyperactivity disorder, predominantly inattentive type: Secondary | ICD-10-CM

## 2024-07-11 DIAGNOSIS — F411 Generalized anxiety disorder: Secondary | ICD-10-CM | POA: Diagnosis not present

## 2024-07-11 DIAGNOSIS — G43909 Migraine, unspecified, not intractable, without status migrainosus: Secondary | ICD-10-CM

## 2024-07-11 DIAGNOSIS — R519 Headache, unspecified: Secondary | ICD-10-CM

## 2024-07-11 MED ORDER — ZONISAMIDE 50 MG PO CAPS: 50.0000 mg | ORAL_CAPSULE | Freq: Every day | ORAL | 1 refills | Status: AC

## 2024-07-11 MED ORDER — RIZATRIPTAN BENZOATE 10 MG PO TBDP: 10.0000 mg | ORAL_TABLET | ORAL | 11 refills | Status: AC | PRN

## 2024-07-11 NOTE — Progress Notes (Signed)
 [facility]  PATIENT: Kiara Travis DOB: 2006/03/28  REFERRING DOCTOR OR PCP: Lucie Buttner, PA SOURCE: Patient, notes from primary care  _________________________________   HISTORICAL  CHIEF COMPLAINT:  Chief Complaint  Patient presents with   New Patient (Initial Visit)    Pt in room 10. Alone. Internal referral for chronic headaches.    HISTORY OF PRESENT ILLNESS:  I had the pleasure of seeing your patient, Kiara Travis, at [facility] for neurologic consultation regarding her daily headaches and episodic migraines.  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  Most days she has a dull ache - usually bifrontal.   She denies occipital pain  These are present upon awakening and often worsen over time.   Caffeine helps the most for a couple hours.  OTC NSAIDs are less effective.  Moving does not change the intensity.   Nothing really increases/decreases the pain much    She is able to function well when the milder HA is occurring.  About once a week, they intensify and she has nausea, sometimes vomiting.  She has photophobia, phonophobia and moving worsens the pain.   Laying down in a cold dark room helps.   Excedrin has not helped much.   Has not been on any chronic treatment or a preventative.      Her brother and several aunts/uncles have migraines.  She has ADD and is on Jornay, other stimulants causing headaches.  She also has anxiety and is on sertraline with good benefit.  She was on OCP.  But there was no change when discontinued so she is back on these meds  REVIEW OF SYSTEMS: Constitutional: No fevers, chills, sweats, or change in appetite Eyes: No visual changes, double vision, eye pain Ear, nose and throat: No hearing loss, ear pain, nasal congestion, sore throat Cardiovascular: No chest pain, palpitations Respiratory:  No shortness of breath at rest or with  exertion.   No wheezes GastrointestinaI: No nausea, vomiting, diarrhea, abdominal pain, fecal incontinence Genitourinary:  No dysuria, urinary retention or frequency.  No nocturia. Musculoskeletal:  No neck pain, back pain Integumentary: No rash, pruritus, skin lesions Neurological: as above Psychiatric: No depression at this time.  SOme anxiety Endocrine: No palpitations, diaphoresis, change in appetite, change in weigh or increased thirst Hematologic/Lymphatic:  No anemia, purpura, petechiae. Allergic/Immunologic: No itchy/runny eyes, nasal congestion, recent allergic reactions, rashes  ALLERGIES: Allergies  Allergen Reactions   Chloramine Hives   Chlorine Rash   Latex Rash   Nystatin Rash and Hives   Sulfa Antibiotics Hives    Verified hives    HOME MEDICATIONS:  Current Outpatient Medications:    chlorhexidine (PERIDEX) 0.12 % solution, Use as directed 5 mLs in the mouth or throat as needed., Disp: , Rfl:    cholecalciferol (VITAMIN D3) 25 MCG (1000 UNIT) tablet, Take 1,000 Units by mouth daily., Disp: , Rfl:    drospirenone -ethinyl estradiol  (YASMIN ) 3-0.03 MG tablet, Take 1 tablet by mouth daily., Disp: 84 tablet, Rfl: 3   ferrous sulfate 324 MG TBEC, Take 324 mg by mouth., Disp: , Rfl:    JORNAY PM  20 MG CP24, Take 1 capsule by mouth at bedtime., Disp: , Rfl:    Multiple Vitamin (MULTIVITAMIN) capsule, Take 1 capsule by mouth daily., Disp: , Rfl:    rizatriptan (MAXALT-MLT) 10 MG disintegrating tablet, Take 1 tablet (10 mg total) by mouth as needed for migraine. May  repeat in 2 hours if needed, Disp: 9 tablet, Rfl: 11   sertraline (ZOLOFT) 50 MG tablet, Take 50 mg by mouth daily., Disp: , Rfl:    zonisamide (ZONEGRAN) 50 MG capsule, Take 1 capsule (50 mg total) by mouth at bedtime., Disp: 90 capsule, Rfl: 1   ibuprofen  (ADVIL ) 600 MG tablet, Take 1 tablet (600 mg total) by mouth every 6 (six) hours as needed., Disp: 30 tablet, Rfl: 1  PAST MEDICAL HISTORY: Past Medical  History:  Diagnosis Date   ADHD (attention deficit hyperactivity disorder)    Allergy    Anxiety    Depression     PAST SURGICAL HISTORY: Past Surgical History:  Procedure Laterality Date   FOOT SURGERY     Right    FAMILY HISTORY: Family History  Problem Relation Age of Onset   Miscarriages / Stillbirths Mother    Arthritis Mother    Hypertension Father    Hyperlipidemia Father    Hearing loss Father    High Cholesterol Brother    Migraines Brother    Arthritis Maternal Grandmother    Drug abuse Maternal Grandfather    Alcohol abuse Maternal Grandfather    Anuerysm Maternal Grandfather    Hyperlipidemia Paternal Grandmother    Heart disease Paternal Grandmother    Aneurysm Paternal Grandmother    Hearing loss Paternal Grandfather    Heart disease Paternal Grandfather    Hyperlipidemia Paternal Grandfather    Heart attack Paternal Grandfather     SOCIAL HISTORY: Social History   Socioeconomic History   Marital status: Single    Spouse name: Not on file   Number of children: Not on file   Years of education: Not on file   Highest education level: Not on file  Occupational History   Not on file  Tobacco Use   Smoking status: Every Day    Types: E-cigarettes    Start date: 08/29/2017   Smokeless tobacco: Never   Tobacco comments:    Nicotine pouches 3-4 days  Vaping Use   Vaping status: Every Day   Substances: Nicotine  Substance and Sexual Activity   Alcohol use: Not Currently    Comment: 1 beer/month   Drug use: No   Sexual activity: Yes    Partners: Male    Birth control/protection: Pill  Other Topics Concern   Not on file  Social History Narrative   [employer] studying nursing   Lives with parents -- currently separated   Social Drivers of Corporate Investment Banker Strain: Not on file  Food Insecurity: Not on file  Transportation Needs: Not on file  Physical Activity: Not on file  Stress: Not on file  Social Connections: Not on file  Intimate  Partner Violence: Not on file       PHYSICAL EXAM  Vitals:   07/11/24 0926  BP: 108/73  Pulse: 100  Weight: 141 lb (64 kg)  Height: 5' 3 (1.6 m)    Body mass index is 24.98 kg/m.   General: The patient is well-developed and well-nourished and in no acute distress  HEENT:  Head is Wade/AT.  Sclera are anicteric.  Funduscopic exam shows normal optic discs and retinal vessels.  Neck: No carotid bruits are noted.  The neck is slightly tender over the occiput  Cardiovascular: The heart has a regular rate and rhythm with a normal S1 and S2. There were no murmurs, gallops or rubs.    Skin: Extremities are without rash or  edema.  Musculoskeletal:  Back is nontender  Neurologic Exam  Mental status: The patient is alert and oriented x 3 at the time of the examination. The patient has apparent normal recent and remote memory, with an apparently normal attention span and concentration ability.   Speech is normal.  Cranial nerves: Extraocular movements are full. Pupils are equal, round, and reactive to light and accomodation.  Visual fields are full.  Facial symmetry is present. There is good facial sensation to soft touch bilaterally.Facial strength is normal.  Trapezius and sternocleidomastoid strength is normal. No dysarthria is noted.  The tongue is midline, and the patient has symmetric elevation of the soft palate. No obvious hearing deficits are noted.  Motor:  Muscle bulk is normal.   Tone is normal. Strength is  5 / 5 in all 4 extremities.   Sensory: Sensory testing is intact to pinprick, soft touch and vibration sensation in all 4 extremities.  Coordination: Cerebellar testing reveals good finger-nose-finger and heel-to-shin bilaterally.  Gait and station: Station is normal.   Gait is normal. Tandem gait is normal. Romberg is negative.   Reflexes: Deep tendon reflexes are symmetric and normal bilaterally.        DIAGNOSTIC DATA (LABS, IMAGING, TESTING) - I reviewed  patient records, labs, notes, testing and imaging myself where available.  Lab Results  Component Value Date   WBC 5.5 01/29/2024   HGB 12.4 01/29/2024   HCT 36.6 01/29/2024   MCV 88.2 01/29/2024   PLT 246.0 01/29/2024      Component Value Date/Time   NA 140 11/09/2023 1033   K 4.2 11/09/2023 1033   CL 106 11/09/2023 1033   CO2 27 11/09/2023 1033   GLUCOSE 65 (L) 11/09/2023 1033   BUN 8 11/09/2023 1033   CREATININE 0.77 11/09/2023 1033   CALCIUM 9.4 11/09/2023 1033   PROT 6.5 11/09/2023 1033   ALBUMIN 4.2 11/09/2023 1033   AST 14 11/09/2023 1033   ALT 14 11/09/2023 1033   ALKPHOS 41 (L) 11/09/2023 1033   BILITOT 0.2 11/09/2023 1033   GFRNONAA NOT CALCULATED 04/28/2020 0025   GFRAA NOT CALCULATED 04/28/2020 0025   Lab Results  Component Value Date   CHOL 164 11/09/2023   HDL 68.50 11/09/2023   LDLCALC 83 11/09/2023   TRIG 61.0 11/09/2023   CHOLHDL 2 11/09/2023   No results found for: HGBA1C No results found for: VITAMINB12 Lab Results  Component Value Date   TSH 1.09 08/28/2023       ASSESSMENT AND PLAN  Chronic daily headache  Episodic migraine  ADHD (attention deficit hyperactivity disorder), inattentive type  GAD (generalized anxiety disorder)   In summary, Ms. Groseclose is a 18 year old woman with chronic daily headaches of uncertain etiology (most likely mild chronic tension type headache) who also has superimposed episodic migraines once a week.    To help reduce the number of headache days, I will add zonisamide 50 mg nightly.  Additionally I prescribed rizatriptan to take as needed when a migraine occurs.   If zonisamide is not effective as a preventative, consider a tricyclic agent, or Keppra and if these do not work, consider an anti-CGRP agent.   If rizatriptan is not effective, consider one of the anti-CGRP oh acute treatment such as Ubrelvy or Nurtec She will return to see me as needed if she does not do well on 1 of these regimens or sooner  if there are new or worsening neurologic symptoms.  Thank you for asking me to see this patient.  Please  let me know if I can be of further assistance with her or other patients in the future.   [place] A. Vear, MD, [facility] 07/11/2024, 10:11 AM Certified in Neurology, Clinical Neurophysiology, Sleep Medicine and Neuroimaging  [facility] [address] [place], [place] [address] [phone]

## 2024-09-29 ENCOUNTER — Other Ambulatory Visit: Payer: Self-pay | Admitting: Obstetrics and Gynecology

## 2024-09-29 DIAGNOSIS — Z3041 Encounter for surveillance of contraceptive pills: Secondary | ICD-10-CM

## 2024-10-01 NOTE — Telephone Encounter (Signed)
 Med refill request:   Drospirenone -ethinyl estradiol  (YASMIN ) 3-0.03 MG tablet  Start:  08/28/23 Disp: 84 tablets Refills:  3  Last AEX:  08/28/23 Next AEX:  Not yet scheduled *Front desk has been asked to contact Pt to schedule an annual visit.  Last MMG (if hormonal med):  N/A Refill authorized? Please Advise.

## 2024-11-08 ENCOUNTER — Ambulatory Visit: Admitting: Radiology

## 2024-11-11 ENCOUNTER — Encounter: Admitting: Physician Assistant
